# Patient Record
Sex: Female | Born: 2000 | Race: Black or African American | Hispanic: No | Marital: Single
Health system: Southern US, Community
[De-identification: ages and names within clinical notes are randomized; demographics above are authoritative.]

## PROBLEM LIST (undated history)

## (undated) DIAGNOSIS — M419 Scoliosis, unspecified: Secondary | ICD-10-CM

## (undated) HISTORY — PX: BACK SURGERY: SHX140

---
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
## 2021-06-01 DIAGNOSIS — J0120 Acute ethmoidal sinusitis, unspecified: Secondary | ICD-10-CM | POA: Diagnosis not present

## 2021-06-01 DIAGNOSIS — Z7251 High risk heterosexual behavior: Secondary | ICD-10-CM | POA: Diagnosis not present

## 2021-06-28 DIAGNOSIS — N921 Excessive and frequent menstruation with irregular cycle: Secondary | ICD-10-CM | POA: Diagnosis not present

## 2021-07-11 DIAGNOSIS — D509 Iron deficiency anemia, unspecified: Secondary | ICD-10-CM | POA: Diagnosis not present

## 2021-07-11 DIAGNOSIS — Z3202 Encounter for pregnancy test, result negative: Secondary | ICD-10-CM | POA: Diagnosis not present

## 2021-07-11 DIAGNOSIS — N921 Excessive and frequent menstruation with irregular cycle: Secondary | ICD-10-CM | POA: Diagnosis not present

## 2021-07-11 DIAGNOSIS — N926 Irregular menstruation, unspecified: Secondary | ICD-10-CM | POA: Diagnosis not present

## 2023-03-19 ENCOUNTER — Other Ambulatory Visit: Payer: Self-pay | Admitting: Nurse Practitioner

## 2023-03-19 ENCOUNTER — Other Ambulatory Visit (HOSPITAL_COMMUNITY)
Admission: RE | Admit: 2023-03-19 | Discharge: 2023-03-19 | Disposition: A | Discharge status: Home / Self Care | Payer: BC Managed Care – PPO | Source: Ambulatory Visit | Attending: Nurse Practitioner | Admitting: Nurse Practitioner

## 2023-03-19 DIAGNOSIS — R102 Pelvic and perineal pain: Secondary | ICD-10-CM | POA: Diagnosis not present

## 2023-03-19 DIAGNOSIS — Z124 Encounter for screening for malignant neoplasm of cervix: Secondary | ICD-10-CM | POA: Insufficient documentation

## 2023-03-19 DIAGNOSIS — Z7251 High risk heterosexual behavior: Secondary | ICD-10-CM | POA: Diagnosis not present

## 2023-03-19 DIAGNOSIS — N898 Other specified noninflammatory disorders of vagina: Secondary | ICD-10-CM | POA: Diagnosis not present

## 2023-03-19 DIAGNOSIS — N920 Excessive and frequent menstruation with regular cycle: Secondary | ICD-10-CM | POA: Diagnosis not present

## 2023-03-19 DIAGNOSIS — R399 Unspecified symptoms and signs involving the genitourinary system: Secondary | ICD-10-CM | POA: Diagnosis not present

## 2023-03-19 DIAGNOSIS — N923 Ovulation bleeding: Secondary | ICD-10-CM | POA: Diagnosis not present

## 2023-03-20 ENCOUNTER — Other Ambulatory Visit: Payer: Self-pay

## 2023-03-20 DIAGNOSIS — Z3202 Encounter for pregnancy test, result negative: Secondary | ICD-10-CM | POA: Diagnosis not present

## 2023-03-20 DIAGNOSIS — N920 Excessive and frequent menstruation with regular cycle: Secondary | ICD-10-CM | POA: Diagnosis not present

## 2023-03-20 DIAGNOSIS — N923 Ovulation bleeding: Secondary | ICD-10-CM | POA: Diagnosis not present

## 2023-03-20 DIAGNOSIS — R102 Pelvic and perineal pain: Secondary | ICD-10-CM | POA: Diagnosis not present

## 2023-03-20 DIAGNOSIS — D500 Iron deficiency anemia secondary to blood loss (chronic): Secondary | ICD-10-CM | POA: Diagnosis not present

## 2023-03-20 MED ORDER — MEDROXYPROGESTERONE ACETATE 150 MG/ML IM SUSP
150.0000 mg | INTRAMUSCULAR | 3 refills | Status: AC
  Filled 2023-03-20: qty 1, 90d supply, fill #0

## 2023-03-21 LAB — CYTOLOGY - PAP: Diagnosis: NEGATIVE

## 2023-03-31 ENCOUNTER — Other Ambulatory Visit: Payer: Self-pay

## 2023-08-12 DIAGNOSIS — K047 Periapical abscess without sinus: Secondary | ICD-10-CM | POA: Diagnosis not present

## 2023-08-12 DIAGNOSIS — S025XXA Fracture of tooth (traumatic), initial encounter for closed fracture: Secondary | ICD-10-CM | POA: Diagnosis not present

## 2023-11-28 DIAGNOSIS — Z6820 Body mass index (BMI) 20.0-20.9, adult: Secondary | ICD-10-CM | POA: Diagnosis not present

## 2023-11-28 DIAGNOSIS — Z708 Other sex counseling: Secondary | ICD-10-CM | POA: Diagnosis not present

## 2023-11-28 DIAGNOSIS — Z113 Encounter for screening for infections with a predominantly sexual mode of transmission: Secondary | ICD-10-CM | POA: Diagnosis not present
